# Patient Record
Sex: Male | Born: 1985 | Race: White | Hispanic: No | Marital: Married | Smoking: Current every day smoker
Health system: Southern US, Community
[De-identification: ages and names within clinical notes are randomized; demographics above are authoritative.]

## PROBLEM LIST (undated history)

## (undated) HISTORY — PX: TONSILLECTOMY: SUR1361

---
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
## 2005-05-06 ENCOUNTER — Emergency Department (HOSPITAL_COMMUNITY): Admission: EM | Admit: 2005-05-06 | Discharge: 2005-05-06 | Payer: Self-pay | Admitting: Emergency Medicine

## 2005-11-25 ENCOUNTER — Emergency Department (HOSPITAL_COMMUNITY): Admission: EM | Admit: 2005-11-25 | Discharge: 2005-11-25 | Payer: Self-pay | Admitting: Emergency Medicine

## 2006-01-06 ENCOUNTER — Emergency Department (HOSPITAL_COMMUNITY): Admission: EM | Admit: 2006-01-06 | Discharge: 2006-01-06 | Payer: Self-pay | Admitting: Emergency Medicine
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
## 2014-06-26 ENCOUNTER — Encounter (HOSPITAL_COMMUNITY): Payer: Self-pay | Admitting: Emergency Medicine

## 2014-06-26 ENCOUNTER — Emergency Department (HOSPITAL_COMMUNITY)
Admission: EM | Admit: 2014-06-26 | Discharge: 2014-06-26 | Disposition: A | Discharge status: Home / Self Care | Payer: Self-pay | Attending: Emergency Medicine | Admitting: Emergency Medicine

## 2014-06-26 DIAGNOSIS — K002 Abnormalities of size and form of teeth: Secondary | ICD-10-CM | POA: Insufficient documentation

## 2014-06-26 DIAGNOSIS — K088 Other specified disorders of teeth and supporting structures: Secondary | ICD-10-CM | POA: Insufficient documentation

## 2014-06-26 DIAGNOSIS — K029 Dental caries, unspecified: Secondary | ICD-10-CM | POA: Insufficient documentation

## 2014-06-26 DIAGNOSIS — K0253 Dental caries on pit and fissure surface penetrating into pulp: Secondary | ICD-10-CM | POA: Insufficient documentation

## 2014-06-26 DIAGNOSIS — K0889 Other specified disorders of teeth and supporting structures: Secondary | ICD-10-CM

## 2014-06-26 DIAGNOSIS — Z720 Tobacco use: Secondary | ICD-10-CM | POA: Insufficient documentation

## 2014-06-26 MED ORDER — HYDROCODONE-ACETAMINOPHEN 5-325 MG PO TABS: 1.0000 | ORAL_TABLET | Freq: Four times a day (QID) | ORAL | Status: AC | PRN

## 2014-06-26 MED ORDER — HYDROCODONE-ACETAMINOPHEN 5-325 MG PO TABS
1.0000 | ORAL_TABLET | Freq: Once | ORAL | Status: AC
  Administered 2014-06-26: 1 via ORAL
  Filled 2014-06-26: qty 1

## 2014-06-26 MED ORDER — AMOXICILLIN 500 MG PO CAPS
500.0000 mg | ORAL_CAPSULE | Freq: Three times a day (TID) | ORAL | Status: AC
Start: 2014-06-26 — End: ?

## 2014-06-26 NOTE — ED Notes (Signed)
Pt c/o toothache x 2 days 

## 2014-06-26 NOTE — ED Provider Notes (Signed)
CSN: [id number]     Arrival date & time 06/26/14  0617 History   First MD Initiated Contact with Patient 06/26/14 [phone]     Chief Complaint  Patient presents with  . Dental Injury   Patient is a 28 y.o. male presenting with tooth pain. The history is provided by the patient.  Dental Pain Location:  Lower Lower teeth location:  31/RL 2nd molar Quality:  Burning and aching Severity:  Severe Onset quality:  Sudden Duration:  2 days Timing:  Constant Progression:  Worsening Chronicity:  New Context: dental caries and poor dentition   Context: not abscess, cap still on, not crown fracture, not dental fracture, not enamel fracture, filling still in place, not intrusion, not malocclusion, not recent dental surgery and not trauma   Relieved by:  Nothing Worsened by:  Hot food/drink, pressure, touching, cold food/drink and jaw movement Ineffective treatments:  Acetaminophen, NSAIDs and topical anesthetic gel Associated symptoms: facial pain and facial swelling   Associated symptoms: no congestion, no difficulty swallowing, no drooling, no fever, no gum swelling, no headaches, no neck pain, no neck swelling, no oral bleeding, no oral lesions and no trismus   Risk factors: chewing tobacco use, lack of dental care and smoking   Risk factors: no alcohol problem, no cancer, no diabetes, no immunosuppression and no periodontal disease     History reviewed. No pertinent past medical history. Past Surgical History  Procedure Laterality Date  . Tonsillectomy     History reviewed. No pertinent family history. History  Substance Use Topics  . Smoking status: Current Every Day Smoker -- 0.50 packs/day    Types: Cigarettes  . Smokeless tobacco: Never Used  . Alcohol Use: No    Review of Systems  Constitutional: Negative for fever.  HENT: Positive for facial swelling. Negative for congestion, drooling and mouth sores.   Musculoskeletal: Negative for neck pain.  Neurological: Negative for  headaches.  All other systems reviewed and are negative.     Allergies  Review of patient's allergies indicates no known allergies.  Home Medications   Prior to Admission medications   Medication Sig Start Date End Date Taking? Authorizing Provider  acetaminophen (TYLENOL) 325 MG tablet Take 650 mg by mouth every 6 (six) hours as needed for mild pain.   Yes Historical Provider, MD  ibuprofen (ADVIL,MOTRIN) 200 MG tablet Take 400 mg by mouth every 6 (six) hours as needed for moderate pain.   Yes Historical Provider, MD  amoxicillin (AMOXIL) 500 MG capsule Take 1 capsule (500 mg total) by mouth 3 (three) times daily. 06/26/14   Dailey Alberson A Forcucci, PA-C  HYDROcodone-acetaminophen (NORCO/VICODIN) 5-325 MG per tablet Take 1 tablet by mouth every 6 (six) hours as needed for moderate pain or severe pain. 06/26/14   Cayci Mcnabb A Forcucci, PA-C   BP 123/55 mmHg  Pulse 76  Temp(Src) 98.2 F (36.8 C) (Oral)  Resp 18  Ht 5\' 10"  (1.778 m)  Wt 135 lb (61.236 kg)  BMI 19.37 kg/m2  SpO2 100% Physical Exam  Constitutional: He appears well-developed and well-nourished. No distress.  HENT:  Head: Normocephalic and atraumatic.  Mouth/Throat: Uvula is midline, oropharynx is clear and moist and mucous membranes are normal. He does not have dentures. No oral lesions. No trismus in the jaw. Abnormal dentition. Dental caries present. No dental abscesses, uvula swelling or lacerations. No oropharyngeal exudate.    Eyes: Conjunctivae and EOM are normal. Pupils are equal, round, and reactive to light. No scleral icterus.  Neck:  Normal range of motion. Neck supple. No JVD present. No thyromegaly present.  Cardiovascular: Normal rate, regular rhythm and intact distal pulses.  Exam reveals no gallop and no friction rub.   No murmur heard. Pulmonary/Chest: Effort normal and breath sounds normal. No respiratory distress. He has no wheezes. He has no rales. He exhibits no tenderness.  Lymphadenopathy:    He has  no cervical adenopathy.  Skin: He is not diaphoretic.  Nursing note and vitals reviewed.   ED Course  Procedures (including critical care time) Labs Review Labs Reviewed - No data to display  Imaging Review No results found.   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 Physical exam reveals alert nontoxic-appearing male with a tooth in the right lower second molar that is fractured to the gumline likely secondary to dental caries and decay. There is no gingival swelling, erythema, visible abscesses, trismus, or facial swelling. Patient is able to tolerate his own secretions. Vital signs are stable. I have offered the patient a dental block here. Patient has declined dental block. I will send the patient home with 2 days of hydrocodone and amoxicillin 3 times a day 7 days. Patient is to return for worsening facial swelling, difficulty opening his mouth, or trouble swallowing. Patient states understanding and agreement at this time. Patient is stable for discharge.    Eben Burowourtney A Forcucci, PA-C 06/26/14 [id number]  Dione Boozeavid Glick, MD 06/26/14 [phone]

## 2014-06-26 NOTE — Discharge Instructions (Signed)
Dental Caries °Dental caries (also called tooth decay) is the most common oral disease. It can occur at any age but is more common in children and young adults.  °HOW DENTAL CARIES DEVELOPS  °The process of decay begins when bacteria and foods (particularly sugars and starches) combine in your mouth to produce plaque. Plaque is a substance that sticks to the hard, outer surface of a tooth (enamel). The bacteria in plaque produce acids that attack enamel. These acids may also attack the root surface of a tooth (cementum) if it is exposed. Repeated attacks dissolve these surfaces and create holes in the tooth (cavities). If left untreated, the acids destroy the other layers of the tooth.  °RISK FACTORS °· Frequent sipping of sugary beverages.   °· Frequent snacking on sugary and starchy foods, especially those that easily get stuck in the teeth.   °· Poor oral hygiene.   °· Dry mouth.   °· Substance abuse such as methamphetamine abuse.   °· Broken or poor-fitting dental restorations.   °· Eating disorders.   °· Gastroesophageal reflux disease (GERD).   °· Certain radiation treatments to the head and neck. °SYMPTOMS °In the early stages of dental caries, symptoms are seldom present. Sometimes white, chalky areas may be seen on the enamel or other tooth layers. In later stages, symptoms may include: °· Pits and holes on the enamel. °· Toothache after sweet, hot, or cold foods or drinks are consumed. °· Pain around the tooth. °· Swelling around the tooth. °DIAGNOSIS  °Most of the time, dental caries is detected during a regular dental checkup. A diagnosis is made after a thorough medical and dental history is taken and the surfaces of your teeth are checked for signs of dental caries. Sometimes special instruments, such as lasers, are used to check for dental caries. Dental X-ray exams may be taken so that areas not visible to the eye (such as between the contact areas of the teeth) can be checked for cavities.    °TREATMENT  °If dental caries is in its early stages, it may be reversed with a fluoride treatment or an application of a remineralizing agent at the dental office. Thorough brushing and flossing at home is needed to aid these treatments. If it is in its later stages, treatment depends on the location and extent of tooth destruction:  °· If a small area of the tooth has been destroyed, the destroyed area will be removed and cavities will be filled with a material such as gold, silver amalgam, or composite resin.   °· If a large area of the tooth has been destroyed, the destroyed area will be removed and a cap (crown) will be fitted over the remaining tooth structure.   °· If the center part of the tooth (pulp) is affected, a procedure called a root canal will be needed before a filling or crown can be placed.   °· If most of the tooth has been destroyed, the tooth may need to be pulled (extracted). °HOME CARE INSTRUCTIONS °You can prevent, stop, or reverse dental caries at home by practicing good oral hygiene. Good oral hygiene includes: °· Thoroughly cleaning your teeth at least twice a day with a toothbrush and dental floss.   °· Using a fluoride toothpaste. A fluoride mouth rinse may also be used if recommended by your dentist or health care provider.   °· Restricting the amount of sugary and starchy foods and sugary liquids you consume.   °· Avoiding frequent snacking on these foods and sipping of these liquids.   °· Keeping regular visits with   a dentist for checkups and cleanings. °PREVENTION  °· Practice good oral hygiene. °· Consider a dental sealant. A dental sealant is a coating material that is applied by your dentist to the pits and grooves of teeth. The sealant prevents food from being trapped in them. It may protect the teeth for several years. °· Ask about fluoride supplements if you live in a community without fluorinated water or with water that has a low fluoride content. Use fluoride supplements  as directed by your dentist or health care provider. °· Allow fluoride varnish applications to teeth if directed by your dentist or health care provider. °Document Released: 04/04/2002 Document Revised: 11/27/2013 Document Reviewed: 07/15/2012 °ExitCare® Patient Information ©2015 [employer]. This information is not intended to replace advice given to you by your health care provider. Make sure you discuss any questions you have with your health care provider. ° °Dental Pain °A tooth ache may be caused by cavities (tooth decay). Cavities expose the nerve of the tooth to air and hot or cold temperatures. It may come from an infection or abscess (also called a boil or furuncle) around your tooth. It is also often caused by dental caries (tooth decay). This causes the pain you are having. °DIAGNOSIS  °Your caregiver can diagnose this problem by exam. °TREATMENT  °· If caused by an infection, it may be treated with medications which kill germs (antibiotics) and pain medications as prescribed by your caregiver. Take medications as directed. °· Only take over-the-counter or prescription medicines for pain, discomfort, or fever as directed by your caregiver. °· Whether the tooth ache today is caused by infection or dental disease, you should see your dentist as soon as possible for further care. °SEEK MEDICAL CARE IF: °The exam and treatment you received today has been provided on an emergency basis only. This is not a substitute for complete medical or dental care. If your problem worsens or new problems (symptoms) appear, and you are unable to meet with your dentist, call or return to this location. °SEEK IMMEDIATE MEDICAL CARE IF:  °· You have a fever. °· You develop redness and swelling of your face, jaw, or neck. °· You are unable to open your mouth. °· You have severe pain uncontrolled by pain medicine. °MAKE SURE YOU:  °· Understand these instructions. °· Will watch your condition. °· Will get help right away if  you are not doing well or get worse. °Document Released: 07/13/2005 Document Revised: 10/05/2011 Document Reviewed: 02/29/2008 °ExitCare® Patient Information ©2015 [employer]. This information is not intended to replace advice given to you by your health care provider. Make sure you discuss any questions you have with your health care provider. ° °Emergency Department Resource Guide °1) Find a Doctor and Pay Out of Pocket °Although you won't have to find out who is covered by your insurance plan, it is a good idea to ask around and get recommendations. You will then need to call the office and see if the doctor you have chosen will accept you as a new patient and what types of options they offer for patients who are self-pay. Some doctors offer discounts or will set up payment plans for their patients who do not have insurance, but you will need to ask so you aren't surprised when you get to your appointment. ° °2) Contact Your Local Health Department °Not all health departments have doctors that can see patients for sick visits, but many do, so it is worth a call to see   if yours does. If you don't know where your local health department is, you can check in your phone book. The CDC also has a tool to help you locate your state's health department, and many state websites also have listings of all of their local health departments. ° °3) Find a Walk-in Clinic °If your illness is not likely to be very severe or complicated, you may want to try a walk in clinic. These are popping up all over the country in pharmacies, drugstores, and shopping centers. They're usually staffed by nurse practitioners or physician assistants that have been trained to treat common illnesses and complaints. They're usually fairly quick and inexpensive. However, if you have serious medical issues or chronic medical problems, these are probably not your best option. ° °No Primary Care Doctor: °- Call [facility] at  832-8000 - they can  help you locate a primary care doctor that  accepts your insurance, provides certain services, etc. °- Physician Referral Service- 1-800-533-3463 ° °Chronic Pain Problems: °Organization         Address  Phone   Notes  °[facility]  (336) 297-2271 Patients need to be referred by their primary care doctor.  ° °Medication Assistance: °Organization         Address  Phone   Notes  °[place] [facility] [address] °[place], [place] [address] (336) 641-8030 --Must be a resident of [place] °-- Must have NO insurance coverage whatsoever (no Medicaid/ Medicare, etc.) °-- The pt. MUST have a primary care doctor that directs their care regularly and follows them in the community °  °[facility]  (866) 331-1348   °[employer]  (888) 892-1162   ° °Agencies that provide inexpensive medical care: °Organization         Address  Phone   Notes  °[facility]  (336) 832-8035   °[facility]    (336) 832-7272   °[facility] [address] °[place], [place] [address] (336) 832-4777   °[facility] [address], °[place] (336) 271-4999   °[facility]    (336) 373-0678   °[facility]    (336) 272-1050   °[facility] ° [address], [place] Phone:  (336) 832-4444, Fax:  (336) 832-4440 Hours of Operation:  9 am - 6 pm, M-F.  Also accepts Medicaid/Medicare and self-pay.  °[facility] ° [address], [place] Phone: (336) 832-3150, Fax: (336) [phone]. Hours of Operation:  8:30 am - 5:30 pm, M-F.  Also accepts Medicaid and self-pay.  °[facility] [place] [address], [place] Phone: (336) 878-6027   °[facility] [address], [place], [place] (336)723-1848, Ext. 123 Mondays & Thursdays: 7-9 AM.  First 15 patients are seen on a first come, first serve basis. °  ° °Medicaid-accepting [place] Providers: ° °Organization         Address  Phone   Notes  °[facility] [address], [place] (336) 641-2100 Also accepts self-pay patients.  °[facility] [address], [place] ° (336) 856-9996   °[facility] [address], [place] (336) 288-8857   °[facility] [address] [place] [address],  (336) 299-7000   °Veita Bland [address],   ° (  336) 373-1557 Only accepts  Access Medicaid patients after they have their name applied to their card.  ° °Self-Pay (no insurance) in [place]: ° °Organization         Address  Phone   Notes  °Sickle Cell Patients, [facility] [address], [place] (336) 832-1970   °[facility] [address], [place] (336) 832-4400   °[facility] [place] ° [address], [place] (336) 992-4800   °[facility]/Dr. Osei-Bonsu ° [address] [place] [address], [place] or [address], [place] (336) 841-8500 Phone number for both [place] and [place] locations is the same.  °[facility] [address], [place] (336) 299-0000   °[facility] [address] [place] [address], [place] or [address] (336) 852-7530 °(336) 878-2260   °[facility] [address], [place] (336) 350-1642, phone; (336) 294-5005, fax Sees patients 1st and 3rd Saturday of every month.  Must not qualify for public or private insurance (i.e. Medicaid, Medicare, [place] Health Choice, Veterans' Benefits) • Household income should be no more than 200% of the poverty level •The clinic cannot treat you if you are pregnant or think you are pregnant • Sexually transmitted diseases are not treated at the clinic.  ° ° °Dental Care: °Organization         Address  Phone  Notes  °[place] [facility]  [facility] [address], [place] (336) 641-6152 Accepts children up to age 21 who are enrolled in Medicaid or [place] Health Choice; pregnant women with a Medicaid card; and children who have applied for Medicaid or [place] Health Choice, but were declined, whose parents can pay a reduced fee at time of service.  °[facility] [place]  [address], [place] (336) 641-7733 Accepts children up to age 21 who are enrolled in Medicaid or [place] Health Choice; pregnant women with a Medicaid card; and children who have applied for Medicaid or [place] Health Choice, but were declined, whose parents can pay a reduced fee at time of service.  °[facility] ° [address], [place] (336) 641-4533 Patients are seen by appointment only. Walk-ins are not accepted. [facility] will see patients 18 years of age and older. °Monday - Tuesday (8am-5pm) °Most Wednesdays (8:30-5pm) °$30 per visit, cash only  °[facility] ° [address], [place] (336) 641-4533 Patients are seen by appointment only. Walk-ins are not accepted. [facility] will see patients 18 years of age and older. °One Wednesday Evening (Monthly: Volunteer Based).  $30 per visit, cash only  °[facility]  (919) 537-3737 for adults; Children under age 4, call Graduate Pediatric Dentistry at ([phone]) [phone]. Children aged 4-14, please call ([phone]) [phone] to request a pediatric application. ° Dental services are provided in all areas of dental care including fillings, crowns and bridges, complete and partial dentures, implants, gum treatment, root canals, and extractions. Preventive care is also provided. Treatment is provided to both adults and children. °Patients are selected via a lottery and there is often a waiting list. °  °[facility] [address], °[place] ° (336) 763-8833 [email or web address] °  °[facility] [address], [place], [place] (336)723-1848, Ext. 123 Second and Fourth Thursday of each month, opens at 6:30   AM; Clinic ends at 9 AM.  Patients are seen on a first-come first-served basis, and a limited number are seen during each clinic.  ° °[facility] ° [address], [place], [place] (336) 723-7904   Eligibility Requirements °You must have lived in [place], or [place] counties for at least the last three months. °  You cannot be eligible for state or federal sponsored healthcare insurance, including Veterans Administration, Medicaid, or Medicare. °  You generally cannot be eligible for healthcare insurance through your employer.  °  How to apply: °Eligibility screenings are held every Tuesday and Wednesday afternoon from 1:00 pm until 4:00 pm. You do not need an appointment for the interview!  °[facility] [address], [place], [place] 336-631-2330   °[facility]  336-342-8273   °[facility]  336-703-3100   °[facility]  336-570-6415   ° °Behavioral Health Resources in the Community: °Intensive Outpatient Programs °Organization         Address  Phone  Notes  °[facility] [address], [place], [place] 336-878-6098   °[facility] [address], [place], [place] 336-832-9800   °[facility] [address], [place], [place] ° 336-882-2125   °[facility] [address],  °[place], [place] 1-800-853-5163 or 336-641-4981   °Substance Abuse Resources °Organization         Address  Phone  Notes  °[facility]  336-882-2125   °[facility]  336-784-9470   °[facility]  336-285-9073   °[facility]  336-845-3988   °Residential & Outpatient Substance Abuse Program  1-800-659-3381   °Psychological Services °Organization         Address  Phone  Notes  °[facility]  336-  832-9600   °[facility]  336- 378-7881   °[facility] [address], [place] 1-800-853-5163 or 336-641-4981   ° °Mobile Crisis Teams °Organization         Address  Phone  Notes  °[facility], Mobile Crisis Care Unit  1-877-626-1772   °Assertive °[facility] ° [address] [place], [place] 336-834-9664   °Sharon DeEsch [address] °[place] 336-554-5454   ° °Self-Help/Support Groups °Organization         Address  Phone             Notes  °[facility] - variety of support groups  336- 373-1402 Call for more information  °Narcotics Anonymous (NA), [facility] [address], °[place]  2 meetings at this location  ° °Residential Treatment Programs °Organization         Address  Phone  Notes  °[facility] [address],    °[place]  1-866-801-8205   °[facility] ° [address], [place], [place] 704-293-8524   °[facility] [address], [place] 336-845-3988 Admissions: 8am-3pm M-F  °[facility] [address]    °[place], [place] 336-841-1104   °[facility] [address], [place], [place] 336-379-7146   °[facility] [address]  °[place], [place] 336-285-9073   °[facility] Intensive Outpatient [address], [place], [place] 336-852-3033   °[facility] [address]  °[place], [place] 1-877-615-2722 or 336-784-9470   °  [facility] [address] [place], [place] 336-227-7417 Accepts Medicaid  °[facility] [address]  °[place] 1-800-659-3381 Substance Abuse/Addiction Treatment  ° °[place] Behavioral Health Resources °Organization         Address  Phone  Notes  °[facility]  (888) 581-9988   °Julie Brannon, PhD [address] [place], [place]   (336) 349-5553 or  (336) 951-0000   °[facility]   [address] °[place], [place] (336) 349-4454   °[facility] [address], [place], [place] (336) 342-8316 Insurance/Medicaid/sponsorship through [facility]  °[facility] [address]                                    [place], [place] (336) 342-8316 Therapy/tele-psych/case  °[facility] [address]  ° [place], [place] (336) 349-2233    °Dr. Arfeen  (336) 349-4544   °[facility] [place]  [employer] [place] [facility] 1) [address], [place] °2) [address], [place] °3)  [address], [place] (336) 349-3220 °(336) 342-7768 ° °(336) 342-8140   °[place] [facility] (336) 342-1394 or (336) 342-3537 (After Hours)    ° °

## 2014-06-26 NOTE — ED Notes (Signed)
Patient alert and oriented at discharge.  Patient verbalized understanding of discharge instructions and the need for follow up care.   

## 2018-07-09 ENCOUNTER — Emergency Department
Admission: EM | Admit: 2018-07-09 | Discharge: 2018-07-09 | Disposition: A | Discharge status: Home / Self Care | Payer: Self-pay | Attending: Emergency Medicine | Admitting: Emergency Medicine

## 2018-07-09 ENCOUNTER — Other Ambulatory Visit: Payer: Self-pay

## 2018-07-09 DIAGNOSIS — W260XXA Contact with knife, initial encounter: Secondary | ICD-10-CM | POA: Insufficient documentation

## 2018-07-09 DIAGNOSIS — Y929 Unspecified place or not applicable: Secondary | ICD-10-CM | POA: Insufficient documentation

## 2018-07-09 DIAGNOSIS — Y9389 Activity, other specified: Secondary | ICD-10-CM | POA: Insufficient documentation

## 2018-07-09 DIAGNOSIS — S61412A Laceration without foreign body of left hand, initial encounter: Secondary | ICD-10-CM | POA: Insufficient documentation

## 2018-07-09 DIAGNOSIS — Y999 Unspecified external cause status: Secondary | ICD-10-CM | POA: Insufficient documentation

## 2018-07-09 DIAGNOSIS — F17210 Nicotine dependence, cigarettes, uncomplicated: Secondary | ICD-10-CM | POA: Insufficient documentation

## 2018-07-09 DIAGNOSIS — Z23 Encounter for immunization: Secondary | ICD-10-CM | POA: Insufficient documentation

## 2018-07-09 MED ORDER — CEPHALEXIN 500 MG PO CAPS: 500.0000 mg | ORAL_CAPSULE | Freq: Four times a day (QID) | ORAL | 0 refills | Status: AC

## 2018-07-09 MED ORDER — TETANUS-DIPHTH-ACELL PERTUSSIS 5-2.5-18.5 LF-MCG/0.5 IM SUSP
0.5000 mL | Freq: Once | INTRAMUSCULAR | Status: AC
  Administered 2018-07-09: 0.5 mL via INTRAMUSCULAR
  Filled 2018-07-09: qty 0.5

## 2018-07-09 MED ORDER — LIDOCAINE HCL (PF) 1 % IJ SOLN
5.0000 mL | Freq: Once | INTRAMUSCULAR | Status: AC
  Administered 2018-07-09: 5 mL via INTRADERMAL
  Filled 2018-07-09: qty 5

## 2018-07-09 NOTE — ED Triage Notes (Signed)
Cut L palm on box cutter at work. NOT filing WC.

## 2018-07-09 NOTE — ED Notes (Signed)
Lac left palm below finger #5. Bleeding controlled. Full sensation, ROM fingers

## 2018-07-09 NOTE — ED Provider Notes (Signed)
[facility] Emergency Department Provider Note  ____________________________________________  Time seen: Approximately 11:56 AM  I have reviewed the triage vital signs and the nursing notes.   HISTORY  Chief Complaint Laceration    HPI John Colon is a 32 y.o. male that presents to the emergency department for evaluation of left hand laceration.  Patient cut himself with a box cutter trying to open a box today.  He is able to move his fingers normally.  Tetanus shot is out of date.  History reviewed. No pertinent past medical history.  There are no active problems to display for this patient.   Past Surgical History:  Procedure Laterality Date  . TONSILLECTOMY      Prior to Admission medications   Medication Sig Start Date End Date Taking? Authorizing Provider  acetaminophen (TYLENOL) 325 MG tablet Take 650 mg by mouth every 6 (six) hours as needed for mild pain.    [provider]  amoxicillin (AMOXIL) 500 MG capsule Take 1 capsule (500 mg total) by mouth 3 (three) times daily. 06/26/14   Shirleen Schirmer, PA-C  cephALEXin (KEFLEX) 500 MG capsule Take 1 capsule (500 mg total) by mouth 4 (four) times daily for 10 days. 07/09/18 07/19/18  Enid Derry, PA-C  HYDROcodone-acetaminophen (NORCO/VICODIN) 5-325 MG per tablet Take 1 tablet by mouth every 6 (six) hours as needed for moderate pain or severe pain. 06/26/14   Shirleen Schirmer, PA-C  ibuprofen (ADVIL,MOTRIN) 200 MG tablet Take 400 mg by mouth every 6 (six) hours as needed for moderate pain.    [provider]    Allergies Patient has no known allergies.  History reviewed. No pertinent family history.  Social History Social History   Tobacco Use  . Smoking status: Current Every Day Smoker    Packs/day: 0.50    Types: Cigarettes  . Smokeless tobacco: Never Used  Substance Use Topics  . Alcohol use: No  . Drug use: No     Review of Systems  Gastrointestinal: No  nausea, no vomiting.  Musculoskeletal: Positive for hand pain Skin: Negative for rash, ecchymosis. Positive for laceration.    ____________________________________________   PHYSICAL EXAM:  VITAL SIGNS: ED Triage Vitals [07/09/18 1151]  Enc Vitals Group     BP 99/65     Pulse Rate 90     Resp 18     Temp 98.2 F (36.8 C)     Temp Source Oral     SpO2 95 %     Weight 134 lb (60.8 kg)     Height 5\' 11"  (1.803 m)     Head Circumference      Peak Flow      Pain Score 8     Pain Loc      Pain Edu?      Excl. in GC?      Constitutional: Alert and oriented. Well appearing and in no acute distress. Eyes: Conjunctivae are normal. PERRL. EOMI. Head: Atraumatic. ENT:      Ears:      Nose: No congestion/rhinnorhea.      Mouth/Throat: Mucous membranes are moist.  Neck: No stridor.   Cardiovascular: Normal rate, regular rhythm.  Good peripheral circulation. Respiratory: Normal respiratory effort without tachypnea or retractions. Lungs CTAB. Good air entry to the bases with no decreased or absent breath sounds. Musculoskeletal: Full range of motion to all extremities. No gross deformities appreciated.  Full range of motion of fingers. Neurologic:  Normal speech and language. No gross  focal neurologic deficits are appreciated.  Skin:  Skin is warm, dry.  3 cm laceration to ulnar side of left hand. Psychiatric: Mood and affect are normal. Speech and behavior are normal. Patient exhibits appropriate insight and judgement.   ____________________________________________   LABS (all labs ordered are listed, but only abnormal results are displayed)  Labs Reviewed - No data to display ____________________________________________  EKG   ____________________________________________  RADIOLOGY   No results found.  ____________________________________________    PROCEDURES  Procedure(s) performed:    Procedures  LACERATION REPAIR Performed by: Enid DerryAshley  Iren Whipp  Consent: Verbal consent obtained.  Consent given by: patient  Prepped and Draped in normal sterile fashion  Wound explored: No foreign bodies   Laceration Location: palm  Laceration Length: 3 cm  Anesthesia: None  Local anesthetic: lidocaine 1% without epinephrine  Anesthetic total: 4 ml  Irrigation method: syringe  Amount of cleaning: 500ml normal saline  Skin closure: 4-0 nylon  Number of sutures: 6  Technique: Simple interrupted  Patient tolerance: Patient tolerated the procedure well with no immediate complications.  Medications  Tdap (BOOSTRIX) injection 0.5 mL (0.5 mLs Intramuscular Given 07/09/18 1256)  lidocaine (PF) (XYLOCAINE) 1 % injection 5 mL (5 mLs Intradermal Given by Other 07/09/18 1257)     ____________________________________________   INITIAL IMPRESSION / ASSESSMENT AND PLAN / ED COURSE  Pertinent labs & imaging results that were available during my care of the patient were reviewed by me and considered in my medical decision making (see chart for details).  Review of the [place] CSRS was performed in accordance of the [facility] prior to dispensing any controlled drugs.     Patient's diagnosis is consistent with hand laceration. Vital signs and exam are reassuring. Laceration was repaired with sutures.  Bandage was applied.  Patient will be discharged home with prescriptions for keflex.  Tetanus shot was updated.  Patient is to follow up with primary care as directed. Patient is given ED precautions to return to the ED for any worsening or new symptoms.     ____________________________________________  FINAL CLINICAL IMPRESSION(S) / ED DIAGNOSES  Final diagnoses:  Laceration of left hand without foreign body, initial encounter      NEW MEDICATIONS STARTED DURING THIS VISIT:  ED Discharge Orders         Ordered    cephALEXin (KEFLEX) 500 MG capsule  4 times daily     07/09/18 1238              This chart was dictated  using voice recognition software/Dragon. Despite best efforts to proofread, errors can occur which can change the meaning. Any change was purely unintentional.    Enid DerryWagner, Arayah Krouse, PA-C 07/09/18 1548    Don PerkingVeronese, [place], MD 08/04/18 [phone]
